# Patient Record
Sex: Female | Born: 1990 | Hispanic: Yes | Marital: Married | Smoking: Never smoker
Health system: Southern US, Community
[De-identification: ages and names within clinical notes are randomized; demographics above are authoritative.]

## PROBLEM LIST (undated history)

## (undated) HISTORY — PX: BREAST ENHANCEMENT SURGERY: SHX7

---

## 2010-05-26 ENCOUNTER — Encounter: Payer: Self-pay | Admitting: Physician Assistant

## 2010-05-26 ENCOUNTER — Other Ambulatory Visit: Admission: RE | Admit: 2010-05-26 | Discharge: 2010-05-26 | Payer: Self-pay | Admitting: Obstetrics and Gynecology

## 2010-05-26 ENCOUNTER — Ambulatory Visit: Payer: Self-pay | Admitting: Obstetrics and Gynecology

## 2010-11-26 ENCOUNTER — Ambulatory Visit: Payer: Self-pay | Admitting: Obstetrics and Gynecology

## 2010-12-02 LAB — POCT PREGNANCY, URINE: Preg Test, Ur: NEGATIVE

## 2012-01-13 ENCOUNTER — Emergency Department (INDEPENDENT_AMBULATORY_CARE_PROVIDER_SITE_OTHER): Payer: Self-pay

## 2012-01-13 ENCOUNTER — Emergency Department (HOSPITAL_BASED_OUTPATIENT_CLINIC_OR_DEPARTMENT_OTHER)
Admission: EM | Admit: 2012-01-13 | Discharge: 2012-01-13 | Disposition: A | Discharge status: Home / Self Care | Payer: Self-pay | Attending: Emergency Medicine | Admitting: Emergency Medicine

## 2012-01-13 ENCOUNTER — Encounter (HOSPITAL_BASED_OUTPATIENT_CLINIC_OR_DEPARTMENT_OTHER): Payer: Self-pay | Admitting: Emergency Medicine

## 2012-01-13 DIAGNOSIS — R1032 Left lower quadrant pain: Secondary | ICD-10-CM

## 2012-01-13 DIAGNOSIS — N83209 Unspecified ovarian cyst, unspecified side: Secondary | ICD-10-CM

## 2012-01-13 DIAGNOSIS — R109 Unspecified abdominal pain: Secondary | ICD-10-CM | POA: Insufficient documentation

## 2012-01-13 DIAGNOSIS — B9689 Other specified bacterial agents as the cause of diseases classified elsewhere: Secondary | ICD-10-CM | POA: Insufficient documentation

## 2012-01-13 DIAGNOSIS — A499 Bacterial infection, unspecified: Secondary | ICD-10-CM | POA: Insufficient documentation

## 2012-01-13 DIAGNOSIS — N760 Acute vaginitis: Secondary | ICD-10-CM | POA: Insufficient documentation

## 2012-01-13 DIAGNOSIS — R110 Nausea: Secondary | ICD-10-CM | POA: Insufficient documentation

## 2012-01-13 DIAGNOSIS — N390 Urinary tract infection, site not specified: Secondary | ICD-10-CM | POA: Insufficient documentation

## 2012-01-13 DIAGNOSIS — Z975 Presence of (intrauterine) contraceptive device: Secondary | ICD-10-CM

## 2012-01-13 LAB — URINALYSIS, ROUTINE W REFLEX MICROSCOPIC
Glucose, UA: NEGATIVE mg/dL
Protein, ur: 30 mg/dL — AB
Specific Gravity, Urine: 1.029 (ref 1.005–1.030)

## 2012-01-13 LAB — WET PREP, GENITAL: Yeast Wet Prep HPF POC: NONE SEEN

## 2012-01-13 LAB — DIFFERENTIAL
Eosinophils Relative: 2 % (ref 0–5)
Lymphocytes Relative: 27 % (ref 12–46)
Lymphs Abs: 1.5 10*3/uL (ref 0.7–4.0)
Monocytes Absolute: 0.7 10*3/uL (ref 0.1–1.0)

## 2012-01-13 LAB — BASIC METABOLIC PANEL
BUN: 11 mg/dL (ref 6–23)
CO2: 26 mEq/L (ref 19–32)
Calcium: 9.2 mg/dL (ref 8.4–10.5)
Creatinine, Ser: 0.6 mg/dL (ref 0.50–1.10)
Glucose, Bld: 92 mg/dL (ref 70–99)

## 2012-01-13 LAB — CBC
HCT: 38.5 % (ref 36.0–46.0)
MCH: 30.3 pg (ref 26.0–34.0)
MCV: 88.5 fL (ref 78.0–100.0)
RBC: 4.35 MIL/uL (ref 3.87–5.11)
WBC: 5.4 10*3/uL (ref 4.0–10.5)

## 2012-01-13 LAB — PREGNANCY, URINE: Preg Test, Ur: NEGATIVE

## 2012-01-13 MED ORDER — CEPHALEXIN 500 MG PO CAPS: 500.0000 mg | ORAL_CAPSULE | Freq: Three times a day (TID) | ORAL | Status: AC

## 2012-01-13 MED ORDER — MORPHINE SULFATE 4 MG/ML IJ SOLN
4.0000 mg | Freq: Once | INTRAMUSCULAR | Status: AC
  Administered 2012-01-13: 4 mg via INTRAVENOUS
  Filled 2012-01-13: qty 1

## 2012-01-13 MED ORDER — ONDANSETRON HCL 4 MG/2ML IJ SOLN
4.0000 mg | Freq: Once | INTRAMUSCULAR | Status: AC
  Administered 2012-01-13: 4 mg via INTRAVENOUS
  Filled 2012-01-13: qty 2

## 2012-01-13 MED ORDER — HYDROCODONE-ACETAMINOPHEN 5-500 MG PO TABS: 1.0000 | ORAL_TABLET | Freq: Four times a day (QID) | ORAL | Status: AC | PRN

## 2012-01-13 MED ORDER — METRONIDAZOLE 500 MG PO TABS: 500.0000 mg | ORAL_TABLET | Freq: Two times a day (BID) | ORAL | Status: AC

## 2012-01-13 NOTE — ED Notes (Signed)
States LLQ pain since last night at 8pm.  Admits to nausea without vomiting. Denies diarrhea.  Denies UTI sx.s

## 2012-01-13 NOTE — Discharge Instructions (Signed)
Bacterial Vaginosis Bacterial vaginosis (BV) is a vaginal infection where the normal balance of bacteria in the vagina is disrupted. The normal balance is then replaced by an overgrowth of certain bacteria. There are several different kinds of bacteria that can cause BV. BV is the most common vaginal infection in women of childbearing age. CAUSES   The cause of BV is not fully understood. BV develops when there is an increase or imbalance of harmful bacteria.   Some activities or behaviors can upset the normal balance of bacteria in the vagina and put women at increased risk including:   Having a new sex partner or multiple sex partners.   Douching.   Using an intrauterine device (IUD) for contraception.   It is not clear what role sexual activity plays in the development of BV. However, women that have never had sexual intercourse are rarely infected with BV.  Women do not get BV from toilet seats, bedding, swimming pools or from touching objects around them.  SYMPTOMS   Grey vaginal discharge.   A fish-like odor with discharge, especially after sexual intercourse.   Itching or burning of the vagina and vulva.   Burning or pain with urination.   Some women have no signs or symptoms at all.  DIAGNOSIS  Your caregiver must examine the vagina for signs of BV. Your caregiver will perform lab tests and look at the sample of vaginal fluid through a microscope. They will look for bacteria and abnormal cells (clue cells), a pH test higher than 4.5, and a positive amine test all associated with BV.  RISKS AND COMPLICATIONS   Pelvic inflammatory disease (PID).   Infections following gynecology surgery.   Developing HIV.   Developing herpes virus.  TREATMENT  Sometimes BV will clear up without treatment. However, all women with symptoms of BV should be treated to avoid complications, especially if gynecology surgery is planned. Female partners generally do not need to be treated. However,  BV may spread between female sex partners so treatment is helpful in preventing a recurrence of BV.   BV may be treated with antibiotics. The antibiotics come in either pill or vaginal cream forms. Either can be used with nonpregnant or pregnant women, but the recommended dosages differ. These antibiotics are not harmful to the baby.   BV can recur after treatment. If this happens, a second round of antibiotics will often be prescribed.   Treatment is important for pregnant women. If not treated, BV can cause a premature delivery, especially for a pregnant woman who had a premature birth in the past. All pregnant women who have symptoms of BV should be checked and treated.   For chronic reoccurrence of BV, treatment with a type of prescribed gel vaginally twice a week is helpful.  HOME CARE INSTRUCTIONS   Finish all medication as directed by your caregiver.   Do not have sex until treatment is completed.   Tell your sexual partner that you have a vaginal infection. They should see their caregiver and be treated if they have problems, such as a mild rash or itching.   Practice safe sex. Use condoms. Only have 1 sex partner.  PREVENTION  Basic prevention steps can help reduce the risk of upsetting the natural balance of bacteria in the vagina and developing BV:  Do not have sexual intercourse (be abstinent).   Do not douche.   Use all of the medicine prescribed for treatment of BV, even if the signs and symptoms go away.     prescribed for treatment of BV, even if the signs and symptoms go away.   Tell your sex partner if you have BV. That way, they can be treated, if needed, to prevent reoccurrence.  SEEK MEDICAL CARE IF:    Your symptoms are not improving after 3 days of treatment.   You have increased discharge, pain, or fever.  MAKE SURE YOU:    Understand these instructions.   Will watch your condition.   Will get help right away if you are not doing well or get worse.  FOR MORE INFORMATION   Division of STD Prevention (DSTDP), Centers for Disease Control and Prevention:  www.cdc.gov/std  American Social Health Association (ASHA): www.ashastd.org   Document Released: 09/05/2005 Document Revised: 08/25/2011 Document Reviewed: 02/26/2009  [employer] Patient Information 2012 [employer]          Ovarian Cyst  The ovaries are small organs that are on each side of the uterus. The ovaries are the organs that produce the female hormones, estrogen and progesterone. An ovarian cyst is a sac filled with fluid that can vary in its size. It is normal for a small cyst to form in women who are in the childbearing age and who have menstrual periods. This type of cyst is called a follicle cyst that becomes an ovulation cyst (corpus luteum cyst) after it produces the women's egg. It later goes away on its own if the woman does not become pregnant. There are other kinds of ovarian cysts that may cause problems and may need to be treated. The most serious problem is a cyst with cancer. It should be noted that menopausal women who have an ovarian cyst are at a higher risk of it being a cancer cyst. They should be evaluated very quickly, thoroughly and followed closely. This is especially true in menopausal women because of the high rate of ovarian cancer in women in menopause.  CAUSES AND TYPES OF OVARIAN CYSTS:   FUNCTIONAL CYST: The follicle/corpus luteum cyst is a functional cyst that occurs every month during ovulation with the menstrual cycle. They go away with the next menstrual cycle if the woman does not get pregnant. Usually, there are no symptoms with a functional cyst.   ENDOMETRIOMA CYST: This cyst develops from the lining of the uterus tissue. This cyst gets in or on the ovary. It grows every month from the bleeding during the menstrual period. It is also called a "chocolate cyst" because it becomes filled with blood that turns brown. This cyst can cause pain in the lower abdomen during intercourse and with your menstrual period.   CYSTADENOMA CYST: This cyst develops from the cells  on the outside of the ovary. They usually are not cancerous. They can get very big and cause lower abdomen pain and pain with intercourse. This type of cyst can twist on itself, cut off its blood supply and cause severe pain. It also can easily rupture and cause a lot of pain.   DERMOID CYST: This type of cyst is sometimes found in both ovaries. They are found to have different kinds of body tissue in the cyst. The tissue includes skin, teeth, hair, and/or cartilage. They usually do not have symptoms unless they get very big. Dermoid cysts are rarely cancerous.   POLYCYSTIC OVARY: This is a rare condition with hormone problems that produces many small cysts on both ovaries. The cysts are follicle-like cysts that never produce an egg and become a corpus luteum. It can cause an increase   problem is unknown. A polycystic ovary is rarely cancerous.   THECA LUTEIN CYST: Occurs when too much hormone (human chorionic gonadotropin) is produced and over-stimulates the ovaries to produce an egg. They are frequently seen when doctors stimulate the ovaries for invitro-fertilization (test tube babies).   LUTEOMA CYST: This cyst is seen during pregnancy. Rarely it can cause an obstruction to the birth canal during labor and delivery. They usually go away after delivery.  SYMPTOMS   Pelvic pain or pressure.   Pain during sexual intercourse.   Increasing girth (swelling) of the abdomen.   Abnormal menstrual periods.   Increasing pain with menstrual periods.   You stop having menstrual periods and you are not pregnant.  DIAGNOSIS  The diagnosis can be made during:  Routine or annual pelvic examination (common).   Ultrasound.   X-ray of the pelvis.   CT Scan.   MRI.   Blood tests.    TREATMENT   Treatment may only be to follow the cyst monthly for 2 to 3 months with your caregiver. Many go away on their own, especially functional cysts.   May be aspirated (drained) with a long needle with ultrasound, or by laparoscopy (inserting a tube into the pelvis through a small incision).   The whole cyst can be removed by laparoscopy.   Sometimes the cyst may need to be removed through an incision in the lower abdomen.   Hormone treatment is sometimes used to help dissolve certain cysts.   Birth control pills are sometimes used to help dissolve certain cysts.  HOME CARE INSTRUCTIONS  Follow your caregiver's advice regarding:  Medicine.   Follow up visits to evaluate and treat the cyst.   You may need to come back or make an appointment with another caregiver, to find the exact cause of your cyst, if your caregiver is not a gynecologist.   Get your yearly and recommended pelvic examinations and Pap tests.   Let your caregiver know if you have had an ovarian cyst in the past.  SEEK MEDICAL CARE IF:   Your periods are late, irregular, they stop, or are painful.   Your stomach (abdomen) or pelvic pain does not go away.   Your stomach becomes larger or swollen.   You have pressure on your bladder or trouble emptying your bladder completely.   You have painful sexual intercourse.   You have feelings of fullness, pressure, or discomfort in your stomach.   You lose weight for no apparent reason.   You feel generally ill.   You become constipated.   You lose your appetite.   You develop acne.   You have an increase in body and facial hair.   You are gaining weight, without changing your exercise and eating habits.   You think you are pregnant.  SEEK IMMEDIATE MEDICAL CARE IF:   You have increasing abdominal pain.   You feel sick to your stomach (nausea) and/or vomit.   You develop a fever that comes on suddenly.   You develop abdominal pain during a  bowel movement.   Your menstrual periods become heavier than usual.  Document Released: 09/05/2005 Document Revised: 08/25/2011 Document Reviewed: 07/09/2009 [facility] Patient Information 2012 [place], [place].Urinary Tract Infection Infections of the urinary tract can start in several places. A bladder infection (cystitis), a kidney infection (pyelonephritis), and a prostate infection (prostatitis) are different types of urinary tract infections (UTIs). They usually get better if treated with medicines (antibiotics) that kill germs. Take all the medicine  until it is gone. You or your child may feel better in a few days, but TAKE ALL MEDICINE or the infection may not respond and may become more difficult to treat. HOME CARE INSTRUCTIONS   Drink enough water and fluids to keep the urine clear or pale yellow. Cranberry juice is especially recommended, in addition to large amounts of water.   Avoid caffeine, tea, and carbonated beverages. They tend to irritate the bladder.   Alcohol may irritate the prostate.   Only take over-the-counter or prescription medicines for pain, discomfort, or fever as directed by your caregiver.  To prevent further infections:  Empty the bladder often. Avoid holding urine for long periods of time.   After a bowel movement, women should cleanse from front to back. Use each tissue only once.   Empty the bladder before and after sexual intercourse.  FINDING OUT THE RESULTS OF YOUR TEST Not all test results are available during your visit. If your or your child's test results are not back during the visit, make an appointment with your caregiver to find out the results. Do not assume everything is normal if you have not heard from your caregiver or the medical facility. It is important for you to follow up on all test results. SEEK MEDICAL CARE IF:   There is back pain.   Your baby is older than 3 months with a rectal temperature of 100.5 F (38.1 C) or higher for  more than 1 day.   Your or your child's problems (symptoms) are no better in 3 days. Return sooner if you or your child is getting worse.  SEEK IMMEDIATE MEDICAL CARE IF:   There is severe back pain or lower abdominal pain.   You or your child develops chills.   You have a fever.   Your baby is older than 3 months with a rectal temperature of 102 F (38.9 C) or higher.   Your baby is 54 months old or younger with a rectal temperature of 100.4 F (38 C) or higher.   There is nausea or vomiting.   There is continued burning or discomfort with urination.  MAKE SURE YOU:   Understand these instructions.   Will watch your condition.   Will get help right away if you are not doing well or get worse.  Document Released: 06/15/2005 Document Revised: 08/25/2011 Document Reviewed: 01/18/2007 [facility] Patient Information 2012 [place], [place].

## 2012-01-13 NOTE — ED Provider Notes (Signed)
History     CSN: [id number]  Arrival date & time 01/13/12  1310   First MD Initiated Contact with Patient 01/13/12 1334      Chief Complaint  Patient presents with  . Abdominal Pain    (Consider location/radiation/quality/duration/timing/severity/associated sxs/prior treatment) Patient is a 21 y.o. female presenting with abdominal pain. The history is provided by the patient. No language interpreter was used.  Abdominal Pain The primary symptoms of the illness include abdominal pain and nausea. The primary symptoms of the illness do not include vomiting, dysuria or vaginal discharge. The current episode started 2 days ago. The onset of the illness was gradual. The problem has not changed since onset. The patient states that she believes she is currently not pregnant. The patient has not had a change in bowel habit.    History reviewed. No pertinent past medical history.  Past Surgical History  Procedure Date  . Cesarean section     No family history on file.  History  Substance Use Topics  . Smoking status: Not on file  . Smokeless tobacco: Not on file  . Alcohol Use: No    OB History    Grav Para Term Preterm Abortions TAB SAB Ect Mult Living                  Review of Systems  Constitutional: Negative.   Respiratory: Negative.   Cardiovascular: Negative.   Gastrointestinal: Positive for nausea and abdominal pain. Negative for vomiting.  Genitourinary: Negative for dysuria and vaginal discharge.  Musculoskeletal: Negative.   Neurological: Negative.     Allergies  Review of patient's allergies indicates no known allergies.  Home Medications  No current outpatient prescriptions on file.  BP 116/73  Pulse 58  Temp(Src) 97.9 F (36.6 C) (Oral)  Resp 20  Ht 5\' 3"  (1.6 m)  Wt 138 lb (62.596 kg)  BMI 24.45 kg/m2  SpO2 100%  LMP 12/09/2011  Physical Exam  Nursing note and vitals reviewed. Constitutional: She is oriented to person, place, and time. She  appears well-developed and well-nourished.  HENT:  Head: Atraumatic.  Eyes: Conjunctivae and EOM are normal.  Neck: Neck supple.  Cardiovascular: Normal rate and regular rhythm.   Pulmonary/Chest: Effort normal and breath sounds normal.  Abdominal: Soft. Bowel sounds are normal. There is tenderness in the left lower quadrant.  Genitourinary:       Vaginal bleeding:noted iud  Neurological: She is alert and oriented to person, place, and time.  Skin: Skin is warm and dry.  Psychiatric: She has a normal mood and affect.    ED Course  Procedures (including critical care time)  Labs Reviewed  URINALYSIS, ROUTINE W REFLEX MICROSCOPIC - Abnormal; Notable for the following:    Color, Urine AMBER (*) BIOCHEMICALS MAY BE AFFECTED BY COLOR   APPearance CLOUDY (*)    Hgb urine dipstick LARGE (*)    Protein, ur 30 (*)    Leukocytes, UA MODERATE (*)    All other components within normal limits  WET PREP, GENITAL - Abnormal; Notable for the following:    Clue Cells Wet Prep HPF POC MODERATE (*)    WBC, Wet Prep HPF POC MODERATE (*)    All other components within normal limits  CBC - Abnormal; Notable for the following:    Platelets 143 (*)    All other components within normal limits  URINE MICROSCOPIC-ADD ON - Abnormal; Notable for the following:    Squamous Epithelial / LPF FEW (*)  Bacteria, UA FEW (*)    All other components within normal limits  PREGNANCY, URINE  DIFFERENTIAL  BASIC METABOLIC PANEL  GC/CHLAMYDIA PROBE AMP, GENITAL   US Transvaginal Non-ob  01/13/2012  *RADIOLOGY REPORT*  Clinical Data: Left lower quadrant abdominal pain.  LMP 12/09/2011. IUD in place  TRANSABDOMINAL AND TRANSVAGINAL ULTRASOUND OF PELVIS Technique:  Both transabdominal and transvaginal ultrasound examinations of the pelvis were performed. Transabdominal technique was performed for global imaging of the pelvis including uterus, ovaries, adnexal regions, and pelvic cul-de-sac.  Comparison: None.   It  was necessary to proceed with endovaginal exam following the transabdominal exam to visualize the myometrium, endometrium and adnexa.  Findings:  Uterus: Is anteverted and anteflexed and demonstrates a sagittal length of 9.1 cm, depth of 4.5 cm and a transverse width of 6.2 cm. A homogeneous myometrium is seen  Endometrium: Is thin with no focal abnormality.  The patient's IUD appears appropriately positioned within the endometrial canal on 2- D imaging  Right ovary:  The right ovary appears normal measuring 3.3 x 2.8 by 2.6 cm  Left ovary: Measures 4.7 x 6.0 x 3.7 cm and contains a thin walled unilocular simple cyst containing two small daughter cysts measuring 5.0 x 3.3 x 4.2 cm. A trace of color flow is seen with Doppler evaluation.  Other findings: A trace of simple free fluid is noted in the cul-de- sac.  IMPRESSION: Normal uterine myometrium, endometrium and right ovary. Appropriate IUD positioning is seen with 2-D imaging.  Unilocular thin-walled left ovarian simple cyst with no worrisome features sonographically. Given the size and appearance this likely represents a functional cyst or benign neoplastic process. A trace of flow is identified within the surrounding ovarian stroma with color Doppler assessment but in the appropriate clinical setting, the possibility of ovarian torsion should not excluded.  Given the current history of pain, follow-up evaluation is recommended in the immediate postsecretory phase of the cycle following the next complete cycle  for short term reassessment.  Original Report Authenticated By: Bertha Stakes, M.D.   US Pelvis Complete  01/13/2012  *RADIOLOGY REPORT*  Clinical Data: Left lower quadrant abdominal pain.  LMP 12/09/2011. IUD in place  TRANSABDOMINAL AND TRANSVAGINAL ULTRASOUND OF PELVIS Technique:  Both transabdominal and transvaginal ultrasound examinations of the pelvis were performed. Transabdominal technique was performed for global imaging of the pelvis  including uterus, ovaries, adnexal regions, and pelvic cul-de-sac.  Comparison: None.   It was necessary to proceed with endovaginal exam following the transabdominal exam to visualize the myometrium, endometrium and adnexa.  Findings:  Uterus: Is anteverted and anteflexed and demonstrates a sagittal length of 9.1 cm, depth of 4.5 cm and a transverse width of 6.2 cm. A homogeneous myometrium is seen  Endometrium: Is thin with no focal abnormality.  The patient's IUD appears appropriately positioned within the endometrial canal on 2- D imaging  Right ovary:  The right ovary appears normal measuring 3.3 x 2.8 by 2.6 cm  Left ovary: Measures 4.7 x 6.0 x 3.7 cm and contains a thin walled unilocular simple cyst containing two small daughter cysts measuring 5.0 x 3.3 x 4.2 cm. A trace of color flow is seen with Doppler evaluation.  Other findings: A trace of simple free fluid is noted in the cul-de- sac.  IMPRESSION: Normal uterine myometrium, endometrium and right ovary. Appropriate IUD positioning is seen with 2-D imaging.  Unilocular thin-walled left ovarian simple cyst with no worrisome features sonographically. Given the size and appearance this likely  represents a functional cyst or benign neoplastic process. A trace of flow is identified within the surrounding ovarian stroma with color Doppler assessment but in the appropriate clinical setting, the possibility of ovarian torsion should not excluded.  Given the current history of pain, follow-up evaluation is recommended in the immediate postsecretory phase of the cycle following the next complete cycle  for short term reassessment.  Original Report Authenticated By: Bertha Stakes, M.D.     1. UTI (lower urinary tract infection)   2. BV (bacterial vaginosis)   3. Ovarian cyst       MDM  Pt is comfortable at this time:pt is okay to follow up with WUJ:WJXB treat for uit and bv        Teressa Lower, NP 01/13/12 1621

## 2012-01-14 LAB — GC/CHLAMYDIA PROBE AMP, GENITAL: GC Probe Amp, Genital: NEGATIVE

## 2012-01-16 NOTE — ED Provider Notes (Signed)
Medical screening examination/treatment/procedure(s) were performed by non-physician practitioner and as supervising physician I was immediately available for consultation/collaboration.   Christepher Melchior, MD 01/16/12 1500 

## 2014-08-18 ENCOUNTER — Emergency Department (HOSPITAL_BASED_OUTPATIENT_CLINIC_OR_DEPARTMENT_OTHER)
Admission: EM | Admit: 2014-08-18 | Discharge: 2014-08-18 | Disposition: A | Discharge status: Home / Self Care | Payer: No Typology Code available for payment source | Attending: Emergency Medicine | Admitting: Emergency Medicine

## 2014-08-18 ENCOUNTER — Encounter (HOSPITAL_BASED_OUTPATIENT_CLINIC_OR_DEPARTMENT_OTHER): Payer: Self-pay | Admitting: *Deleted

## 2014-08-18 ENCOUNTER — Emergency Department (HOSPITAL_BASED_OUTPATIENT_CLINIC_OR_DEPARTMENT_OTHER): Payer: No Typology Code available for payment source

## 2014-08-18 DIAGNOSIS — S43492A Other sprain of left shoulder joint, initial encounter: Secondary | ICD-10-CM | POA: Insufficient documentation

## 2014-08-18 DIAGNOSIS — Y998 Other external cause status: Secondary | ICD-10-CM | POA: Insufficient documentation

## 2014-08-18 DIAGNOSIS — S0990XA Unspecified injury of head, initial encounter: Secondary | ICD-10-CM | POA: Insufficient documentation

## 2014-08-18 DIAGNOSIS — Y92410 Unspecified street and highway as the place of occurrence of the external cause: Secondary | ICD-10-CM | POA: Insufficient documentation

## 2014-08-18 DIAGNOSIS — S43402A Unspecified sprain of left shoulder joint, initial encounter: Secondary | ICD-10-CM

## 2014-08-18 DIAGNOSIS — S0993XA Unspecified injury of face, initial encounter: Secondary | ICD-10-CM | POA: Insufficient documentation

## 2014-08-18 DIAGNOSIS — Y9389 Activity, other specified: Secondary | ICD-10-CM | POA: Insufficient documentation

## 2014-08-18 DIAGNOSIS — H9192 Unspecified hearing loss, left ear: Secondary | ICD-10-CM | POA: Insufficient documentation

## 2014-08-18 DIAGNOSIS — S139XXA Sprain of joints and ligaments of unspecified parts of neck, initial encounter: Secondary | ICD-10-CM | POA: Insufficient documentation

## 2014-08-18 LAB — PREGNANCY, URINE: PREG TEST UR: NEGATIVE

## 2014-08-18 MED ORDER — OXYCODONE-ACETAMINOPHEN 5-325 MG PO TABS
1.0000 | ORAL_TABLET | Freq: Once | ORAL | Status: AC
  Administered 2014-08-18: 1 via ORAL
  Filled 2014-08-18: qty 1

## 2014-08-18 MED ORDER — OXYCODONE-ACETAMINOPHEN 5-325 MG PO TABS: 1.0000 | ORAL_TABLET | ORAL | Status: DC | PRN
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
## 2014-08-18 NOTE — Discharge Instructions (Signed)
Back Pain, Adult °Low back pain is very common. About 1 in 5 people have back pain. The cause of low back pain is rarely dangerous. The pain often gets better over time. About half of people with a sudden onset of back pain feel better in just 2 weeks. About 8 in 10 people feel better by 6 weeks.  °CAUSES °Some common causes of back pain include: °· Strain of the muscles or ligaments supporting the spine. °· Wear and tear (degeneration) of the spinal discs. °· Arthritis. °· Direct injury to the back. °DIAGNOSIS °Most of the time, the direct cause of low back pain is not known. However, back pain can be treated effectively even when the exact cause of the pain is unknown. Answering your caregiver's questions about your overall health and symptoms is one of the most accurate ways to make sure the cause of your pain is not dangerous. If your caregiver needs more information, he or she may order lab work or imaging tests (X-rays or MRIs). However, even if imaging tests show changes in your back, this usually does not require surgery. °HOME CARE INSTRUCTIONS °For many people, back pain returns. Since low back pain is rarely dangerous, it is often a condition that people can learn to manage on their own.  °· Remain active. It is stressful on the back to sit or stand in one place. Do not sit, drive, or stand in one place for more than 30 minutes at a time. Take short walks on level surfaces as soon as pain allows. Try to increase the length of time you walk each day. °· Do not stay in bed. Resting more than 1 or 2 days can delay your recovery. °· Do not avoid exercise or work. Your body is made to move. It is not dangerous to be active, even though your back may hurt. Your back will likely heal faster if you return to being active before your pain is gone. °· Pay attention to your body when you  bend and lift. Many people have less discomfort when lifting if they bend their knees, keep the load close to their bodies, and  avoid twisting. Often, the most comfortable positions are those that put less stress on your recovering back. °· Find a comfortable position to sleep. Use a firm mattress and lie on your side with your knees slightly bent. If you lie on your back, put a pillow under your knees. °· Only take over-the-counter or prescription medicines as directed by your caregiver. Over-the-counter medicines to reduce pain and inflammation are often the most helpful. Your caregiver may prescribe muscle relaxant drugs. These medicines help dull your pain so you can more quickly return to your normal activities and healthy exercise. °· Put ice on the injured area. °· Put ice in a plastic bag. °· Place a towel between your skin and the bag. °· Leave the ice on for 15-20 minutes, 03-04 times a day for the first 2 to 3 days. After that, ice and heat may be alternated to reduce pain and spasms. °· Ask your caregiver about trying back exercises and gentle massage. This may be of some benefit. °· Avoid feeling anxious or stressed. Stress increases muscle tension and can worsen back pain. It is important to recognize when you are anxious or stressed and learn ways to manage it. Exercise is a great option. °SEEK MEDICAL CARE IF: °· You have pain that is not relieved with rest or medicine. °· You have pain that does not improve in 1 week. °· You have new symptoms. °· You are generally not feeling well. °SEEK   IMMEDIATE MEDICAL CARE IF:  °· You have pain that radiates from your back into your legs. °· You develop new bowel or bladder control problems. °· You have unusual weakness or numbness in your arms or legs. °· You develop nausea or vomiting. °· You develop abdominal pain. °· You feel faint. °Document Released: 09/05/2005 Document Revised: 03/06/2012 Document Reviewed: 01/07/2014 °ExitCare® Patient Information ©2015 [employer]. This information is not intended to replace advice given to you by your health care provider. Make sure you  discuss any questions you have with your health care provider. ° °Motor Vehicle Collision °It is common to have multiple bruises and sore muscles after a motor vehicle collision (MVC). These tend to feel worse for the first 24 hours. You may have the most stiffness and soreness over the first several hours. You may also feel worse when you wake up the first morning after your collision. After this point, you will usually begin to improve with each day. The speed of improvement often depends on the severity of the collision, the number of injuries, and the location and nature of these injuries. °HOME CARE INSTRUCTIONS °· Put ice on the injured area. °¨ Put ice in a plastic bag. °¨ Place a towel between your skin and the bag. °¨ Leave the ice on for 15-20 minutes, 3-4 times a day, or as directed by your health care provider. °· Drink enough fluids to keep your urine clear or pale yellow. Do not drink alcohol. °· Take a warm shower or bath once or twice a day. This will increase blood flow to sore muscles. °· You may return to activities as directed by your caregiver. Be careful when lifting, as this may aggravate neck or back pain. °· Only take over-the-counter or prescription medicines for pain, discomfort, or fever as directed by your caregiver. Do not use aspirin. This may increase bruising and bleeding. °SEEK IMMEDIATE MEDICAL CARE IF: °· You have numbness, tingling, or weakness in the arms or legs. °· You develop severe headaches not relieved with medicine. °· You have severe neck pain, especially tenderness in the middle of the back of your neck. °· You have changes in bowel or bladder control. °· There is increasing pain in any area of the body. °· You have shortness of breath, light-headedness, dizziness, or fainting. °· You have chest pain. °· You feel sick to your stomach (nauseous), throw up (vomit), or sweat. °· You have increasing abdominal discomfort. °· There is blood in your urine, stool, or  vomit. °· You have pain in your shoulder (shoulder strap areas). °· You feel your symptoms are getting worse. °MAKE SURE YOU: °· Understand these instructions. °· Will watch your condition. °· Will get help right away if you are not doing well or get worse. °Document Released: 09/05/2005 Document Revised: 01/20/2014 Document Reviewed: 02/02/2011 °ExitCare® Patient Information ©2015 [employer]. This information is not intended to replace advice given to you by your health care provider. Make sure you discuss any questions you have with your health care provider. ° °
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
## 2016-12-23 ENCOUNTER — Encounter (HOSPITAL_BASED_OUTPATIENT_CLINIC_OR_DEPARTMENT_OTHER): Payer: Self-pay | Admitting: *Deleted

## 2018-01-11 ENCOUNTER — Encounter (HOSPITAL_COMMUNITY): Payer: Self-pay | Admitting: *Deleted

## 2018-02-15 ENCOUNTER — Ambulatory Visit: Payer: Self-pay | Admitting: Obstetrics & Gynecology

## 2018-03-13 ENCOUNTER — Ambulatory Visit (HOSPITAL_COMMUNITY)
Admission: RE | Admit: 2018-03-13 | Discharge: 2018-03-13 | Disposition: A | Discharge status: Home / Self Care | Payer: Self-pay | Source: Ambulatory Visit | Attending: Obstetrics and Gynecology | Admitting: Obstetrics and Gynecology

## 2018-03-13 ENCOUNTER — Encounter (HOSPITAL_COMMUNITY): Payer: Self-pay | Admitting: *Deleted

## 2018-03-13 VITALS — BP 112/72 | Ht 63.0 in

## 2018-03-13 DIAGNOSIS — R87610 Atypical squamous cells of undetermined significance on cytologic smear of cervix (ASC-US): Secondary | ICD-10-CM

## 2018-03-13 DIAGNOSIS — R87810 Cervical high risk human papillomavirus (HPV) DNA test positive: Secondary | ICD-10-CM

## 2018-03-13 DIAGNOSIS — Z1239 Encounter for other screening for malignant neoplasm of breast: Secondary | ICD-10-CM

## 2018-03-13 NOTE — Patient Instructions (Addendum)
Explained breast self awareness with [name]. Patient did not need a Pap smear today due to last Pap smear was 11/30/2017. Explained the colposcopy the recommended follow-up for her abnormal Pap smear. [facility] for a colpos[facility] to follow-up for her abnormal Pap smear. Appointment scheduled for Friday, March 16, 2018 at 0815. Patient aware of appointment and will be there. Let patient know that a screening mammogram is recommended at age 27 unless clinically indicated prior. [name] verbalized understanding.  Bevin Mayall, Kathaleen Maserhristine Poll, RN 9:12 AM

## 2018-03-13 NOTE — Progress Notes (Signed)
[facility] due to having an abnormal Pap smear 11/30/2017 and a colposcopy is recommended for follow-up.  Pap Smear: Pap smear not completed today. Last [facility] and ASCUS with positive HPV. [facility] for a colposcopy to follow-up for her abnormal Pap smear. Appointment scheduled for Friday, March 16, 2018 at 0815. Per patient has a history of an abnormal Pap smear in 2011 that a colposcopy was completed for follow-up. Last Pap smear result is in Epic.  Physical exam: Breasts Breasts symmetrical. No skin abnormalities bilateral breasts. No nipple retraction left breast. Right nipple slightly inverted that per patient is normal for her. No nipple discharge bilateral breasts. Right nipple dry. No lymphadenopathy. No lumps palpated bilateral breasts. No complaints of pain or tenderness on exam. Screening mammogram recommended at age 27 unless clinically indicated prior.     Pelvic/Bimanual No Pap smear completed today since last Pap smear and HPV typing was 11/30/2017. Pap smear not indicated per [facility] guidelines.   Smoking History: Patient has never smoked.  Patient Navigation: Patient education provided. Access to services provided for patient through [facility] program.   Breast and Cervical Cancer Risk Assessment: Patient has no family history of breast cancer, known genetic mutations, or radiation treatment to the chest before age 29. Per patient has a history of cervical dysplasia. Patient has no history of being immunocompromised or DES exposure in-utero. Breast Cancer risk assessment completed but due to patient being under 35 no risk score calculated.

## 2018-03-13 NOTE — Addendum Note (Signed)
Encounter addended by: Priscille HeidelbergBrannock, Christine P, RN on: 03/13/2018 9:43 AM  Actions taken: Sign clinical note

## 2018-03-14 ENCOUNTER — Encounter (HOSPITAL_COMMUNITY): Payer: Self-pay | Admitting: *Deleted

## 2018-03-16 ENCOUNTER — Other Ambulatory Visit: Payer: Self-pay

## 2018-03-16 ENCOUNTER — Ambulatory Visit (INDEPENDENT_AMBULATORY_CARE_PROVIDER_SITE_OTHER): Payer: Self-pay | Admitting: Obstetrics & Gynecology

## 2018-03-16 ENCOUNTER — Other Ambulatory Visit (HOSPITAL_COMMUNITY)
Admission: RE | Admit: 2018-03-16 | Discharge: 2018-03-16 | Disposition: A | Discharge status: Home / Self Care | Payer: Self-pay | Source: Ambulatory Visit | Attending: Obstetrics & Gynecology | Admitting: Obstetrics & Gynecology

## 2018-03-16 ENCOUNTER — Encounter: Payer: Self-pay | Admitting: Obstetrics & Gynecology

## 2018-03-16 VITALS — BP 127/74 | HR 67 | Wt 187.0 lb

## 2018-03-16 DIAGNOSIS — R87610 Atypical squamous cells of undetermined significance on cytologic smear of cervix (ASC-US): Secondary | ICD-10-CM

## 2018-03-16 DIAGNOSIS — R87810 Cervical high risk human papillomavirus (HPV) DNA test positive: Secondary | ICD-10-CM

## 2018-03-16 LAB — POCT PREGNANCY, URINE: Preg Test, Ur: NEGATIVE

## 2018-03-16 NOTE — Progress Notes (Signed)
   Subjective:    Patient ID: Wendy Mccall, female    DOB: Aug 27, 1991, 27 y.o.   MRN: [id number]  HPI 27 yo P3 Latina here for a colpo. [facility] ASCUS + HR HPV. She reports that she had a colpo in 2011 but no treatment.  She uses IUD for contraception.   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 Objective:   Physical Exam Breathing, conversing, and ambulating normally Well nourished, well hydrated Latina, no apparent distress  UPT negative, consent signed, time out done Cervix prepped with acetic acid. Transformation zone seen in its entirety. Colpo adequate. IUD strings about 4 cm (She is not bothered by the length) Entirely normal colpo. ECC obtained. She tolerated the procedure well.     Assessment & Plan:  ASCUS + HR HPV pap Await pathology results Rec that she get Gardasil at the health dept

## 2018-03-20 ENCOUNTER — Telehealth: Payer: Self-pay | Admitting: General Practice

## 2018-03-20 NOTE — Telephone Encounter (Signed)
Left message on VM for patient to give our office a call back in regards to appt on 04/12/18 at 10:35am.  Interpreter used for this call.

## 2018-03-21 ENCOUNTER — Encounter: Payer: Self-pay | Admitting: Obstetrics & Gynecology

## 2018-03-30 ENCOUNTER — Encounter: Payer: Self-pay | Admitting: *Deleted

## 2018-04-12 ENCOUNTER — Encounter: Payer: Self-pay | Admitting: Obstetrics & Gynecology

## 2018-04-12 ENCOUNTER — Ambulatory Visit: Payer: Self-pay | Admitting: Obstetrics & Gynecology

## 2018-04-12 ENCOUNTER — Encounter (HOSPITAL_COMMUNITY): Payer: Self-pay

## 2018-04-12 VITALS — BP 136/63 | HR 68 | Wt 187.4 lb

## 2018-04-12 DIAGNOSIS — D069 Carcinoma in situ of cervix, unspecified: Secondary | ICD-10-CM

## 2018-04-12 NOTE — Progress Notes (Signed)
   Subjective:    Patient ID: Wendy GravesMaritza Mccall, female    DOB: [birth year], 27 y.o.   MRN: [id number]  HPI 27 yo P3 Latina here for a colpo. [facility] ASCUS + HR HPV. She reports that she had a colpo in 2011 but no treatment. The colpo done on 03/16/18 was normal, however the pathology report is as follows:  Endocervix, curettage - HIGH GRADE SQUAMOUS INTRAEPITHELIAL LESION (HSIL, CIN-III). - HIGH GRADE DYSPLASIA EXTENDS INTO THE ENDOCERVICAL GLANDS. - NO DEFINITE EVIDENCE OF INVASIVE CARCINOMA IN SUBMITTED MATERIAL. - FRAGMENTS OF BENIGN ENDOCERVICAL POLYP.  Review of Systems She uses an IUD for contraception.    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 She will need a CKC. I have rec'd that fill out paperwork [facility]. I sent KapowsinJacinda a message to schedule this.

## 2018-04-16 ENCOUNTER — Encounter (HOSPITAL_COMMUNITY): Payer: Self-pay

## 2018-04-30 NOTE — Anesthesia Preprocedure Evaluation (Addendum)
Anesthesia Evaluation  Patient identified by MRN, date of birth, ID band Patient awake    Reviewed: Allergy & Precautions, NPO status , Patient's Chart, lab work & pertinent test results  Airway Mallampati: II  TM Distance: >3 FB Neck ROM: Full    Dental no notable dental hx. (+) Teeth Intact, Dental Advisory Given   Pulmonary neg pulmonary ROS,    Pulmonary exam normal breath sounds clear to auscultation       Cardiovascular Exercise Tolerance: Good Normal cardiovascular exam Rhythm:Regular Rate:Normal     Neuro/Psych negative neurological ROS  negative psych ROS   GI/Hepatic Neg liver ROS,   Endo/Other  Morbid obesity  Renal/GU negative Renal ROS     Musculoskeletal   Abdominal (+) + obese,   Peds  Hematology negative hematology ROS (+)   Anesthesia Other Findings   Reproductive/Obstetrics negative OB ROS                             Lab Results  Component Value Date   WBC 5.4 01/13/2012   HGB 13.2 01/13/2012   HCT 38.5 01/13/2012   MCV 88.5 01/13/2012   PLT 143 (L) 01/13/2012    Anesthesia Physical Anesthesia Plan  ASA: II  Anesthesia Plan: General   Post-op Pain Management:    Induction: Intravenous  PONV Risk Score and Plan:   Airway Management Planned: LMA  Additional Equipment:   Intra-op Plan:   Post-operative Plan:   Informed Consent: I have reviewed the patients History and Physical, chart, labs and discussed the procedure including the risks, benefits and alternatives for the proposed anesthesia with the patient or authorized representative who has indicated his/her understanding and acceptance.     Plan Discussed with:   Anesthesia Plan Comments:         Anesthesia Quick Evaluation

## 2018-05-01 ENCOUNTER — Encounter (HOSPITAL_COMMUNITY): Payer: Self-pay | Admitting: Emergency Medicine

## 2018-05-01 ENCOUNTER — Ambulatory Visit (HOSPITAL_COMMUNITY): Payer: Self-pay | Admitting: Anesthesiology

## 2018-05-01 ENCOUNTER — Other Ambulatory Visit: Payer: Self-pay

## 2018-05-01 ENCOUNTER — Ambulatory Visit (HOSPITAL_COMMUNITY)
Admission: RE | Admit: 2018-05-01 | Discharge: 2018-05-01 | Disposition: A | Discharge status: Home / Self Care | Payer: Self-pay | Source: Ambulatory Visit | Attending: Obstetrics & Gynecology | Admitting: Obstetrics & Gynecology

## 2018-05-01 ENCOUNTER — Encounter (HOSPITAL_COMMUNITY)
Admission: RE | Disposition: A | Discharge status: Home / Self Care | Payer: Self-pay | Source: Ambulatory Visit | Attending: Obstetrics & Gynecology

## 2018-05-01 DIAGNOSIS — D069 Carcinoma in situ of cervix, unspecified: Secondary | ICD-10-CM

## 2018-05-01 DIAGNOSIS — Z9889 Other specified postprocedural states: Secondary | ICD-10-CM | POA: Insufficient documentation

## 2018-05-01 DIAGNOSIS — Z79899 Other long term (current) drug therapy: Secondary | ICD-10-CM | POA: Insufficient documentation

## 2018-05-01 DIAGNOSIS — N871 Moderate cervical dysplasia: Secondary | ICD-10-CM | POA: Insufficient documentation

## 2018-05-01 DIAGNOSIS — Z8249 Family history of ischemic heart disease and other diseases of the circulatory system: Secondary | ICD-10-CM | POA: Insufficient documentation

## 2018-05-01 HISTORY — PX: CERVICAL CONIZATION W/BX: SHX1330

## 2018-05-01 LAB — PREGNANCY, URINE: Preg Test, Ur: NEGATIVE

## 2018-05-01 SURGERY — CONE BIOPSY, CERVIX
Anesthesia: General | Site: Vagina

## 2018-05-01 MED ORDER — GABAPENTIN 300 MG PO CAPS
ORAL_CAPSULE | ORAL | Status: AC
  Administered 2018-05-01: 300 mg via ORAL
  Filled 2018-05-01: qty 1

## 2018-05-01 MED ORDER — IODINE STRONG (LUGOLS) 5 % PO SOLN
ORAL | Status: DC | PRN
  Administered 2018-05-01: 0.1 mL

## 2018-05-01 MED ORDER — MIDAZOLAM HCL 2 MG/2ML IJ SOLN
INTRAMUSCULAR | Status: AC
  Filled 2018-05-01: qty 2

## 2018-05-01 MED ORDER — SCOPOLAMINE 1 MG/3DAYS TD PT72
MEDICATED_PATCH | TRANSDERMAL | Status: AC
  Administered 2018-05-01: 1.5 mg via TRANSDERMAL
  Filled 2018-05-01: qty 1

## 2018-05-01 MED ORDER — IODINE STRONG (LUGOLS) 5 % PO SOLN
ORAL | Status: AC
  Filled 2018-05-01: qty 1

## 2018-05-01 MED ORDER — ACETIC ACID 5 % SOLN
Status: AC
  Filled 2018-05-01: qty 500

## 2018-05-01 MED ORDER — LACTATED RINGERS IV SOLN
INTRAVENOUS | Status: DC
  Administered 2018-05-01: 09:00:00 via INTRAVENOUS

## 2018-05-01 MED ORDER — LIDOCAINE HCL (CARDIAC) PF 100 MG/5ML IV SOSY
PREFILLED_SYRINGE | INTRAVENOUS | Status: AC
  Filled 2018-05-01: qty 5

## 2018-05-01 MED ORDER — PROMETHAZINE HCL 25 MG/ML IJ SOLN: 6.2500 mg | INTRAMUSCULAR | Status: DC | PRN

## 2018-05-01 MED ORDER — HYDROCODONE-ACETAMINOPHEN 7.5-325 MG PO TABS: 1.0000 | ORAL_TABLET | Freq: Once | ORAL | Status: DC | PRN

## 2018-05-01 MED ORDER — BUPIVACAINE HCL (PF) 0.5 % IJ SOLN
INTRAMUSCULAR | Status: DC | PRN
  Administered 2018-05-01: 30 mL

## 2018-05-01 MED ORDER — SCOPOLAMINE 1 MG/3DAYS TD PT72
1.0000 | MEDICATED_PATCH | Freq: Once | TRANSDERMAL | Status: DC
  Administered 2018-05-01: 1.5 mg via TRANSDERMAL

## 2018-05-01 MED ORDER — PROPOFOL 10 MG/ML IV BOLUS
INTRAVENOUS | Status: DC | PRN
  Administered 2018-05-01: 200 mg via INTRAVENOUS

## 2018-05-01 MED ORDER — OXYCODONE-ACETAMINOPHEN 5-325 MG PO TABS: 1.0000 | ORAL_TABLET | ORAL | 0 refills | Status: AC | PRN

## 2018-05-01 MED ORDER — PROPOFOL 10 MG/ML IV BOLUS
INTRAVENOUS | Status: AC
  Filled 2018-05-01: qty 20

## 2018-05-01 MED ORDER — MIDAZOLAM HCL 2 MG/2ML IJ SOLN
INTRAMUSCULAR | Status: DC | PRN
  Administered 2018-05-01: 2 mg via INTRAVENOUS

## 2018-05-01 MED ORDER — IBUPROFEN 600 MG PO TABS: 600.0000 mg | ORAL_TABLET | Freq: Four times a day (QID) | ORAL | 1 refills | Status: AC | PRN

## 2018-05-01 MED ORDER — DEXAMETHASONE SODIUM PHOSPHATE 10 MG/ML IJ SOLN
INTRAMUSCULAR | Status: AC
  Filled 2018-05-01: qty 1

## 2018-05-01 MED ORDER — MEPERIDINE HCL 25 MG/ML IJ SOLN: 6.2500 mg | INTRAMUSCULAR | Status: DC | PRN

## 2018-05-01 MED ORDER — ONDANSETRON HCL 4 MG/2ML IJ SOLN
INTRAMUSCULAR | Status: DC | PRN
  Administered 2018-05-01: 4 mg via INTRAVENOUS

## 2018-05-01 MED ORDER — ONDANSETRON HCL 4 MG/2ML IJ SOLN
INTRAMUSCULAR | Status: AC
  Filled 2018-05-01: qty 2

## 2018-05-01 MED ORDER — FENTANYL CITRATE (PF) 100 MCG/2ML IJ SOLN
INTRAMUSCULAR | Status: DC | PRN
  Administered 2018-05-01: 100 ug via INTRAVENOUS

## 2018-05-01 MED ORDER — GABAPENTIN 300 MG PO CAPS
300.0000 mg | ORAL_CAPSULE | Freq: Once | ORAL | Status: AC
  Administered 2018-05-01: 300 mg via ORAL

## 2018-05-01 MED ORDER — FERRIC SUBSULFATE 259 MG/GM EX SOLN
CUTANEOUS | Status: AC
  Filled 2018-05-01: qty 8

## 2018-05-01 MED ORDER — FENTANYL CITRATE (PF) 100 MCG/2ML IJ SOLN
INTRAMUSCULAR | Status: AC
  Filled 2018-05-01: qty 2

## 2018-05-01 MED ORDER — BUPIVACAINE HCL (PF) 0.5 % IJ SOLN
INTRAMUSCULAR | Status: AC
  Filled 2018-05-01: qty 30

## 2018-05-01 MED ORDER — ACETAMINOPHEN 500 MG PO TABS
ORAL_TABLET | ORAL | Status: AC
  Administered 2018-05-01: 1000 mg via ORAL
  Filled 2018-05-01: qty 2

## 2018-05-01 MED ORDER — ACETAMINOPHEN 500 MG PO TABS
1000.0000 mg | ORAL_TABLET | Freq: Once | ORAL | Status: AC
  Administered 2018-05-01: 1000 mg via ORAL

## 2018-05-01 MED ORDER — HYDROMORPHONE HCL 1 MG/ML IJ SOLN: 0.2500 mg | INTRAMUSCULAR | Status: DC | PRN

## 2018-05-01 MED ORDER — DEXAMETHASONE SODIUM PHOSPHATE 10 MG/ML IJ SOLN
INTRAMUSCULAR | Status: DC | PRN
  Administered 2018-05-01: 10 mg via INTRAVENOUS

## 2018-05-01 MED ORDER — LIDOCAINE HCL (CARDIAC) PF 100 MG/5ML IV SOSY
PREFILLED_SYRINGE | INTRAVENOUS | Status: DC | PRN
  Administered 2018-05-01: 100 mg via INTRAVENOUS

## 2018-05-01 MED ORDER — ACETAMINOPHEN 10 MG/ML IV SOLN: 1000.0000 mg | Freq: Once | INTRAVENOUS | Status: DC | PRN

## 2018-05-01 MED ORDER — KETOROLAC TROMETHAMINE 30 MG/ML IJ SOLN
INTRAMUSCULAR | Status: AC
  Filled 2018-05-01: qty 1

## 2018-05-01 SURGICAL SUPPLY — 25 items
BLADE SURG 11 STRL SS (BLADE) ×3 IMPLANT
CLEANER TIP ELECTROSURG 2X2 (MISCELLANEOUS) ×3 IMPLANT
DILATOR CANAL MILEX (MISCELLANEOUS) ×3 IMPLANT
ELECT REM PT RETURN 9FT ADLT (ELECTROSURGICAL) ×3
ELECTRODE REM PT RTRN 9FT ADLT (ELECTROSURGICAL) ×1 IMPLANT
GLOVE BIO SURGEON STRL SZ 6.5 (GLOVE) ×2 IMPLANT
GLOVE BIO SURGEONS STRL SZ 6.5 (GLOVE) ×1
GLOVE BIOGEL PI IND STRL 6 (GLOVE) ×1 IMPLANT
GLOVE BIOGEL PI IND STRL 7.0 (GLOVE) ×1 IMPLANT
GLOVE BIOGEL PI INDICATOR 6 (GLOVE) ×2
GLOVE BIOGEL PI INDICATOR 7.0 (GLOVE) ×2
GOWN STRL REUS W/TWL LRG LVL3 (GOWN DISPOSABLE) ×6 IMPLANT
NEEDLE SPNL 18GX3.5 QUINCKE PK (NEEDLE) ×3 IMPLANT
PACK VAGINAL MINOR WOMEN LF (CUSTOM PROCEDURE TRAY) ×3 IMPLANT
PAD OB MATERNITY 4.3X12.25 (PERSONAL CARE ITEMS) ×3 IMPLANT
PENCIL BUTTON HOLSTER BLD 10FT (ELECTRODE) ×3 IMPLANT
SCOPETTES 8  STERILE (MISCELLANEOUS) ×2
SCOPETTES 8 STERILE (MISCELLANEOUS) ×1 IMPLANT
SPONGE SURGIFOAM ABS GEL 12-7 (HEMOSTASIS) ×3 IMPLANT
SUT VIC AB 0 CT1 27 (SUTURE) ×4
SUT VIC AB 0 CT1 27XBRD ANBCTR (SUTURE) ×2 IMPLANT
TOWEL OR 17X24 6PK STRL BLUE (TOWEL DISPOSABLE) ×3 IMPLANT
TUBING NON-CON 1/4 X 20 CONN (TUBING) ×2 IMPLANT
TUBING NON-CON 1/4 X 20' CONN (TUBING) ×1
YANKAUER SUCT BULB TIP NO VENT (SUCTIONS) ×3 IMPLANT

## 2018-05-01 NOTE — Anesthesia Procedure Notes (Signed)
Procedure Name: LMA Insertion Date/Time: 05/01/2018 9:44 AM Performed by: Armanda HeritageSterling, Koree Staheli M, CRNA Pre-anesthesia Checklist: Patient identified, Emergency Drugs available, Suction available, Patient being monitored and Timeout performed Patient Re-evaluated:Patient Re-evaluated prior to induction Oxygen Delivery Method: Circle system utilized Preoxygenation: Pre-oxygenation with 100% oxygen Induction Type: IV induction LMA: LMA inserted LMA Size: 4.0 Grade View: Grade I Number of attempts: 1 ETT to lip (cm): at lips.

## 2018-05-01 NOTE — Discharge Instructions (Signed)
Cervical Conization, Care After This sheet gives you information about how to care for yourself after your procedure. Your doctor may also give you more specific instructions. If you have problems or questions, contact your doctor. Follow these instructions at home: Medicines  Take over-the-counter and prescription medicines only as told by your doctor.  Do not take aspirin until your doctor says it is okay.  If you take pain medicine: ? You may have constipation. To help treat this, your doctor may tell you to:  Drink enough fluid to keep your pee (urine) clear or pale yellow.  Take medicines.  Eat foods that are high in fiber. These include fresh fruits and vegetables, whole grains, bran, and beans.  Limit foods that are high in fat and sugar. These include fried foods and sweet foods. ? Do not drive or use heavy machines. General instructions  You can eat your usual diet unless your doctor tells you not to do so.  Take showers for the first week. Do not take baths, swim, or use hot tubs until your doctor says it is okay.  Do not douche, use tampons, or have sex until your doctor says it is okay.  For 7-14 days after your procedure, avoid: ? Being very active. ? Exercising. ? Heavy lifting.  Keep all follow-up visits as told by your doctor. This is important. Contact a doctor if:  You have a rash.  You are dizzy or lightheaded.  You feel sick to your stomach (nauseous).  You throw up (vomit).  You have fluid from your vagina (vaginal discharge) that smells bad. Get help right away if:  There are blood clots coming from your vagina.  You have more bleeding than you would have in a normal period. For example, you soak a pad in less than 1 hour.  You have a fever.  You have more and more cramps.  You pass out (faint).  You have pain when peeing.  Your have a lot of pain.  Your pain gets worse.  Your pain does not get better when you take your  medicine.  You have blood in your pee.  You throw up (vomit). Summary  After your procedure, take over-the-counter and prescription medicines only as told by your doctor.  Do not douche, use tampons, or have sex until your doctor says it is okay.  For about 7-14 days after your procedure, try not to exercise or lift heavy objects.  Get help right away if you have new symptoms, or if your symptoms become worse.  Post Anesthesia Home Care Instructions  Activity: Get plenty of rest for the remainder of the day. A responsible individual must stay with you for 24 hours following the procedure.  For the next 24 hours, DO NOT: -Drive a car -Advertising copywriterperate machinery -Drink alcoholic beverages -Take any medication unless instructed by your physician -Make any legal decisions or sign important papers.  Meals: Start with liquid foods such as gelatin or soup. Progress to regular foods as tolerated. Avoid greasy, spicy, heavy foods. If nausea and/or vomiting occur, drink only clear liquids until the nausea and/or vomiting subsides. Call your physician if vomiting continues.  Special Instructions/Symptoms: Your throat may feel dry or sore from the anesthesia or the breathing tube placed in your throat during surgery. If this causes discomfort, gargle with warm salt water. The discomfort should disappear within 24 hours.  If you had a scopolamine patch placed behind your ear for the management of post- operative nausea and/or  vomiting:  1. The medication in the patch is effective for 72 hours, after which it should be removed.  Wrap patch in a tissue and discard in the trash. Wash hands thoroughly with soap and water. 2. You may remove the patch earlier than 72 hours if you experience unpleasant side effects which may include dry mouth, dizziness or visual disturbances. 3. Avoid touching the patch. Wash your hands with soap and water after contact with the patch.

## 2018-05-01 NOTE — Op Note (Signed)
05/01/2018  10:16 AM  PATIENT:  Wendy GravesMaritza Mccall  27 y.o. 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   Delay start of Pharmacological VTE agent (>24hrs) due to surgical blood loss or risk of bleeding: not applicable  The risks, benefits, and alternatives of surgery were explained, understood, and accepted. She was taken to the operating room and placed in the dorsal lithotomy position. Her vagina was prepped and draped in the usual sterile fashion. A timeout was done. A bimanual exam revealed a normal size and shape, anteverted mobile uterus. Nonenlarged adnexa were noted. She has a generous pubic arch, a 2nd degree cystocele and mild uterine descensus.  A weighted speculum was placed in the posterior aspect the vagina and the cervix was visualized. Lugol's solution was generously applied to the cervix. There was a large non- staining area, in a circumferential fashion. The anterior lip of the cervix was grasped with a single-tooth tenaculum, and a paracervical block was done with 30 mL of 0.5% Marcaine. The IUD with its 4cm strings was removed and noted to be intact.  A cone shaped specimen of the cervix was removed and endocervical curettings were obtained. The cone bed was cauterized with the Bovie. Hemostasis was noted. I replaced the IUD.  A small piece of gelfoam was  placed on the cone bed.  A pursestring closure of the cervix was done with a 0 Vicryl suture. Excellent hemostasis was noted. The instrument sponge, and needle counts were correct. She was taken to the recovery room in stable condition. She tolerated the procedure well.

## 2018-05-01 NOTE — H&P (Signed)
Wendy GravesMaritza Mccall is an 27 yo married P3 Latina here for a colpo. [facility] ASCUS + HR HPV. Wendy Mccall reports that Wendy Mccall had a colpo in 2011 but no treatment. The colpo done on 03/16/18 was normal, however the pathology report is as follows:  Endocervix, curettage - HIGH GRADE SQUAMOUS INTRAEPITHELIAL LESION (HSIL, CIN-III). - HIGH GRADE DYSPLASIA EXTENDS INTO THE ENDOCERVICAL GLANDS. - NO DEFINITE EVIDENCE OF INVASIVE CARCINOMA IN SUBMITTED MATERIAL. - FRAGMENTS OF BENIGN ENDOCERVICAL POLYP.   Wendy Mccall's last menstrual period was 04/24/2018.    History reviewed. No pertinent past medical history.  Past Surgical History:  Procedure Laterality Date  . BREAST ENHANCEMENT SURGERY Bilateral   . CESAREAN SECTION      Family History  Problem Relation Age of Onset  . Hypertension Mother     Social History:  reports that Wendy Mccall has never smoked. Wendy Mccall has never used smokeless tobacco. Wendy Mccall reports that Wendy Mccall drinks alcohol. Wendy Mccall reports that Wendy Mccall does not use drugs.  Allergies: No Known Allergies  Medications Prior to Admission  Medication Sig Dispense Refill Last Dose  . levonorgestrel (MIRENA) 20 MCG/24HR IUD 1 each by Intrauterine route once.   Taking    ROS  Blood pressure 121/88, pulse 76, temperature 98 F (36.7 C), temperature source Oral, resp. rate 16, height 5\' 3"  (1.6 m), weight 84.8 kg, last menstrual period 04/24/2018, SpO2 100 %. Physical Exam  Heart- rrr Lungs- CTAB Abd- benign  Results for orders placed or performed during the hospital encounter of 05/01/18 (from the past 24 hour(s))  Pregnancy, urine     Status: None   Collection Time: 05/01/18  8:20 AM  Result Value Ref Range   Preg Test, Ur NEGATIVE NEGATIVE    No results found.  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 Wendy Mccall wishes to proceed.     Allie BossierMyra C  Trey Bebee 05/01/2018, 9:10 AM

## 2018-05-01 NOTE — Transfer of Care (Signed)
Immediate Anesthesia Transfer of Care Note  Patient: Wendy Mccall  Procedure(s) Performed: CONIZATION CERVIX WITH BIOPSY - COLD KNIFE (N/A Vagina )  Patient Location: PACU  Anesthesia Type:General  Level of Consciousness: awake  Airway & Oxygen Therapy: Patient Spontanous Breathing and Patient connected to nasal cannula oxygen  Post-op Assessment: Report given to RN and Post -op Vital signs reviewed and stable  Post vital signs: Reviewed and stable  Last Vitals:  Vitals Value Taken Time  BP    Temp    Pulse 80 05/01/2018 10:35 AM  Resp    SpO2 98 % 05/01/2018 10:35 AM  Vitals shown include unvalidated device data.  Last Pain:  Vitals:   05/01/18 0903  TempSrc: Oral      Patients Stated Pain Goal: 4 (05/01/18 0903)  Complications: No apparent anesthesia complications

## 2018-05-01 NOTE — Anesthesia Postprocedure Evaluation (Signed)
Anesthesia Post Note  Patient: Social workerMaritza Gonzalez-Morales  Procedure(s) Performed: CONIZATION CERVIX WITH BIOPSY - COLD KNIFE (N/A Vagina )     Patient location during evaluation: PACU Anesthesia Type: General Level of consciousness: awake and alert Pain management: pain level controlled Vital Signs Assessment: post-procedure vital signs reviewed and stable Respiratory status: spontaneous breathing, nonlabored ventilation, respiratory function stable and patient connected to nasal cannula oxygen Cardiovascular status: blood pressure returned to baseline and stable Postop Assessment: no apparent nausea or vomiting Anesthetic complications: no    Last Vitals:  Vitals:   05/01/18 1115 05/01/18 1130  BP: 124/77 115/80  Pulse: 72 65  Resp: 17 15  Temp:    SpO2: 100% 99%    Last Pain:  Vitals:   05/01/18 1130  TempSrc:   PainSc: 0-No pain   Pain Goal: Patients Stated Pain Goal: 4 (05/01/18 0903)               Trevor IhaStephen A Serapio Edelson

## 2018-05-01 NOTE — Addendum Note (Signed)
Addendum  created 05/01/18 1155 by Armanda HeritageSterling, Satoria Dunlop M, CRNA   Charge Capture section accepted

## 2018-05-02 ENCOUNTER — Encounter (HOSPITAL_COMMUNITY): Payer: Self-pay | Admitting: Obstetrics & Gynecology

## 2018-06-13 ENCOUNTER — Ambulatory Visit (INDEPENDENT_AMBULATORY_CARE_PROVIDER_SITE_OTHER): Payer: Self-pay | Admitting: Obstetrics & Gynecology

## 2018-06-13 VITALS — BP 135/76 | HR 87 | Ht 63.0 in | Wt 185.2 lb

## 2018-06-13 DIAGNOSIS — Z9889 Other specified postprocedural states: Secondary | ICD-10-CM

## 2018-06-13 NOTE — Progress Notes (Signed)
   Subjective:    Patient ID: Wendy Mccall, female    DOB: 10-13-1990, 27 y.o.   MRN: [id number]  HPI 27 yo here for a postop visit. I did a CKC on 05/01/18 for CIN3 with +ECC. She is having no post op problems. She has not had sex as she is separated.   Review of Systems     Objective:   Physical Exam Breathing, conversing, and ambulating normally Well nourished, well hydrated Latina, no apparent distress  Cervix- healed well IUD string seen     Assessment & Plan:  Post op- doing well She will need a pap with cotesting in a year

## 2019-05-09 ENCOUNTER — Ambulatory Visit (HOSPITAL_COMMUNITY): Payer: Self-pay

## 2019-06-18 ENCOUNTER — Ambulatory Visit (HOSPITAL_COMMUNITY): Payer: Self-pay

## 2023-02-09 ENCOUNTER — Encounter: Payer: Self-pay | Admitting: Nurse Practitioner

## 2023-08-14 ENCOUNTER — Telehealth: Payer: Self-pay

## 2023-08-14 NOTE — Telephone Encounter (Signed)
Last one, I promise.   Can you help me out with this Prior Auth please.  ZOXWRU 0.25MG /0.5ML Auto-injectors.

## 2023-08-15 ENCOUNTER — Other Ambulatory Visit: Payer: Self-pay
